# Patient Record
Sex: Female | Born: 1970 | Race: White | Hispanic: No | Marital: Married | Smoking: Never smoker
Health system: Southern US, Community
[De-identification: ages and names within clinical notes are randomized; demographics above are authoritative.]

## PROBLEM LIST (undated history)

## (undated) DIAGNOSIS — M549 Dorsalgia, unspecified: Secondary | ICD-10-CM

## (undated) DIAGNOSIS — G8929 Other chronic pain: Secondary | ICD-10-CM

## (undated) HISTORY — PX: TONSILLECTOMY: SUR1361

## (undated) HISTORY — PX: WISDOM TOOTH EXTRACTION: SHX21

---

## 1995-10-05 HISTORY — PX: SPINAL GROWTH RODS: SHX2427

## 1999-10-22 ENCOUNTER — Other Ambulatory Visit: Admission: RE | Admit: 1999-10-22 | Discharge: 1999-10-22 | Payer: Self-pay | Admitting: Family Medicine

## 2000-11-10 ENCOUNTER — Other Ambulatory Visit: Admission: RE | Admit: 2000-11-10 | Discharge: 2000-11-10 | Payer: Self-pay | Admitting: Family Medicine

## 2001-01-10 ENCOUNTER — Encounter (INDEPENDENT_AMBULATORY_CARE_PROVIDER_SITE_OTHER): Payer: Self-pay

## 2001-01-10 ENCOUNTER — Ambulatory Visit (HOSPITAL_COMMUNITY): Admission: RE | Admit: 2001-01-10 | Discharge: 2001-01-10 | Payer: Self-pay | Admitting: Gastroenterology

## 2001-11-14 ENCOUNTER — Other Ambulatory Visit: Admission: RE | Admit: 2001-11-14 | Discharge: 2001-11-14 | Payer: Self-pay | Admitting: Family Medicine

## 2003-10-28 ENCOUNTER — Other Ambulatory Visit: Admission: RE | Admit: 2003-10-28 | Discharge: 2003-10-28 | Payer: Self-pay | Admitting: Obstetrics and Gynecology

## 2004-04-02 ENCOUNTER — Inpatient Hospital Stay (HOSPITAL_COMMUNITY): Admission: AD | Admit: 2004-04-02 | Discharge: 2004-04-05 | Payer: Self-pay | Admitting: Obstetrics and Gynecology

## 2004-04-06 ENCOUNTER — Encounter: Admission: RE | Admit: 2004-04-06 | Discharge: 2004-05-06 | Payer: Self-pay | Admitting: Obstetrics and Gynecology

## 2005-06-22 ENCOUNTER — Other Ambulatory Visit: Admission: RE | Admit: 2005-06-22 | Discharge: 2005-06-22 | Payer: Self-pay | Admitting: Family Medicine

## 2005-12-28 ENCOUNTER — Other Ambulatory Visit: Admission: RE | Admit: 2005-12-28 | Discharge: 2005-12-28 | Payer: Self-pay | Admitting: Family Medicine

## 2007-06-13 ENCOUNTER — Other Ambulatory Visit: Admission: RE | Admit: 2007-06-13 | Discharge: 2007-06-13 | Payer: Self-pay | Admitting: Family Medicine

## 2008-09-12 ENCOUNTER — Other Ambulatory Visit: Admission: RE | Admit: 2008-09-12 | Discharge: 2008-09-12 | Payer: Self-pay | Admitting: Family Medicine

## 2008-10-04 HISTORY — PX: CHOLECYSTECTOMY: SHX55

## 2009-01-26 ENCOUNTER — Encounter (INDEPENDENT_AMBULATORY_CARE_PROVIDER_SITE_OTHER): Payer: Self-pay | Admitting: General Surgery

## 2009-01-26 ENCOUNTER — Inpatient Hospital Stay (HOSPITAL_COMMUNITY): Admission: EM | Admit: 2009-01-26 | Discharge: 2009-01-27 | Payer: Self-pay | Admitting: Emergency Medicine

## 2009-08-14 ENCOUNTER — Encounter: Admission: RE | Admit: 2009-08-14 | Discharge: 2009-08-14 | Payer: Self-pay | Admitting: Family Medicine

## 2010-01-01 ENCOUNTER — Other Ambulatory Visit: Admission: RE | Admit: 2010-01-01 | Discharge: 2010-01-01 | Payer: Self-pay | Admitting: Family Medicine

## 2010-08-17 ENCOUNTER — Encounter: Admission: RE | Admit: 2010-08-17 | Discharge: 2010-08-17 | Payer: Self-pay | Admitting: Family Medicine

## 2011-01-13 LAB — CBC
HCT: 39.5 % (ref 36.0–46.0)
Hemoglobin: 13.7 g/dL (ref 12.0–15.0)
MCHC: 34.7 g/dL (ref 30.0–36.0)
MCV: 94.7 fL (ref 78.0–100.0)
Platelets: 223 10*3/uL (ref 150–400)
RDW: 12.7 % (ref 11.5–15.5)

## 2011-01-13 LAB — HEPATIC FUNCTION PANEL
ALT: 31 U/L (ref 0–35)
AST: 24 U/L (ref 0–37)
Albumin: 4 g/dL (ref 3.5–5.2)
Alkaline Phosphatase: 76 U/L (ref 39–117)
Bilirubin, Direct: 0.1 mg/dL (ref 0.0–0.3)
Indirect Bilirubin: 0.8 mg/dL (ref 0.3–0.9)
Total Bilirubin: 0.9 mg/dL (ref 0.3–1.2)
Total Protein: 7.8 g/dL (ref 6.0–8.3)

## 2011-01-13 LAB — COMPREHENSIVE METABOLIC PANEL
Albumin: 4 g/dL (ref 3.5–5.2)
Alkaline Phosphatase: 71 U/L (ref 39–117)
BUN: 17 mg/dL (ref 6–23)
Calcium: 9.2 mg/dL (ref 8.4–10.5)
Creatinine, Ser: 0.73 mg/dL (ref 0.4–1.2)
Glucose, Bld: 84 mg/dL (ref 70–99)
Total Protein: 7.6 g/dL (ref 6.0–8.3)

## 2011-01-13 LAB — URINE CULTURE
Colony Count: NO GROWTH
Culture: NO GROWTH

## 2011-01-13 LAB — DIFFERENTIAL
Basophils Relative: 0 % (ref 0–1)
Lymphocytes Relative: 14 % (ref 12–46)
Lymphs Abs: 1.4 10*3/uL (ref 0.7–4.0)
Monocytes Relative: 6 % (ref 3–12)
Neutro Abs: 7.5 10*3/uL (ref 1.7–7.7)
Neutrophils Relative %: 75 % (ref 43–77)

## 2011-01-13 LAB — URINE MICROSCOPIC-ADD ON

## 2011-01-13 LAB — URINALYSIS, ROUTINE W REFLEX MICROSCOPIC
Glucose, UA: NEGATIVE mg/dL
Ketones, ur: 40 mg/dL — AB
Leukocytes, UA: NEGATIVE
pH: 6 (ref 5.0–8.0)

## 2011-01-24 ENCOUNTER — Emergency Department (HOSPITAL_COMMUNITY): Payer: 59

## 2011-01-24 ENCOUNTER — Emergency Department (HOSPITAL_COMMUNITY)
Admission: EM | Admit: 2011-01-24 | Discharge: 2011-01-24 | Discharge status: Against Medical Advice | Payer: 59 | Attending: Emergency Medicine | Admitting: Emergency Medicine

## 2011-01-24 DIAGNOSIS — IMO0001 Reserved for inherently not codable concepts without codable children: Secondary | ICD-10-CM | POA: Insufficient documentation

## 2011-02-16 NOTE — H&P (Signed)
**Note Katie via Obfuscation** [name], Mcpherson             ACCOUNT NO.:  [id number]   MEDICAL RECORD NO.:  [id number]          PATIENT TYPE:  INP   LOCATION:  5118                         FACILITY:  [facility]   PHYSICIAN:  Gabrielle Dare. Janee Morn, M.D.DATE OF BIRTH:  [birth year]   DATE OF ADMISSION:  01/26/2009  DATE OF DISCHARGE:                              HISTORY & PHYSICAL   CHIEF COMPLAINT:  Right upper quadrant abdominal pain/right flank and  back pain.   HISTORY OF PRESENT ILLNESS:  Katie Mcpherson is a 40 year old white female  has no past medical history who began having right flank and back pain  this past Tuesday.  She states that she did not have any associated  nausea or vomiting with this.  She did not have any diarrhea or  constipation and has had normal bowel movements.  She did say that she  had had some emesis with severe pain, but nothing consistent.  Her pain  has wavered over the last several days.  However, yesterday on Saturday,  her pain came back very severe.  She does admit to having several  episodes similar to this in the past at the last 24-48 hours  approximately and then go away.  Due to this pain, the patient presented  to the emergency department overnight and ultrasound was obtained, which  showed a 1.7 cm stone in the neck of the gallbladder with some  associated gallbladder wall thickening.  Because of this, we were called  by Dr. Read Drivers in the ED to see the patient.   REVIEW OF SYSTEMS:  Please see HPI.  Otherwise all other systems are  negative.   FAMILY HISTORY:  Noncontributory.   PAST MEDICAL HISTORY:  Kyphosis deformity of the spine.   PAST SURGICAL HISTORY:  1. C-section.  2. Back surgery with two rods placed in January 1997.  3. Tonsillectomy in high school.   SOCIAL HISTORY:  The patient is married and has one daughter.  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  She does not smoke  and has rare alcohol use and denies any illicit drug use.   ALLERGIES:  NKDA.   MEDICATIONS:  None.   PHYSICAL EXAMINATION:  GENERAL:  Katie Mcpherson is a 39 year old very  pleasant well-developed, well-nourished, white female who is currently  lying in bed in no acute distress, but has just received pain medicine  and states she feels much better.  VITAL SIGNS:  Temperature 98.3, pulse 81, respirations 16, blood  pressure 94/60.  HEENT:  Head is normocephalic, atraumatic.  Sclerae are noninjected.  Pupils are equal, round and reactive to light.  Ears and nose without  any obvious masses or lesions and on rhinorrhea.  Mouth is pink and  moist.  Throat shows no exudate.  NECK:  Supple.  Trachea is midline.  No thyromegaly.  HEART:  Regular rate and rhythm.  Normal S1 and S2.  No murmurs,  gallops, or rubs are noted.  +2 carotid, radial, and pedal pulses  bilaterally.  LUNGS:  Clear to auscultation bilaterally with no wheezes, rhonchi, or  rales noted.  Respiratory effort is nonlabored.  ABDOMEN:  Soft with some right upper quadrant tenderness and a positive  Murphy sign.  She did just receive pain medicine, which I believe has  affected her exam some.  Otherwise, she is nondistended and does have  active bowel sounds.  No other masses or hernias are noted.  MUSCULOSKELETAL:  All four extremities are symmetrical with no cyanosis,  clubbing, or edema.  SKIN:  Warm and dry.  NEUROLOGIC:  Cranial nerves II through XII appear to be grossly intact.  PSYCHIATRY:  The patient is alert and oriented x3 with an appropriate  affect.   LABS AND DIAGNOSTICS:  White blood cell count is 9900, hemoglobin 13.7,  hematocrit 39.5, platelet count is 223,000.  Sodium 136, potassium 3.9,  glucose 84, BUN 17, creatinine 0.73.  AST 24, ALT 28, total bilirubin  0.9, alkaline phosphatase 76.  Ultrasound of the abdomen and pelvis  reveals an 1.7-cm stone in the neck of her gallbladder with some  associated gallbladder wall thickening, but no pericholecystic fluid and  no  dilatation of the common bile duct is noted.   IMPRESSION:  1. Biliary colic.  2. Cholelithiasis.  3. Kyphosis of the spine.   PLAN:  At this time, we will admit the patient and start her empirically  on Unasyn.  She will remain n.p.o. in preparation for surgical  intervention later today.  She is also given various other p.r.n.  medications for pain as well as nausea and vomiting.      Letha Cape, PA      Gabrielle Dare. Janee Morn, M.D.  Electronically Signed    KEO/MEDQ  D:  01/26/2009  T:  01/26/2009  Job:  [id number]

## 2011-02-16 NOTE — Op Note (Signed)
Katie Mcpherson, Katie Mcpherson             ACCOUNT NO.:  [id number]   MEDICAL RECORD NO.:  [id number]          PATIENT TYPE:  INP   LOCATION:  5118                         FACILITY:  [facility]   PHYSICIAN:  Gabrielle Dare. Janee Morn, M.D.DATE OF BIRTH:  1971/05/14   DATE OF PROCEDURE:  01/26/2009  DATE OF DISCHARGE:                               OPERATIVE REPORT   PREOPERATIVE DIAGNOSIS:  Acute cholecystitis.   POSTOPERATIVE DIAGNOSIS:  Acute cholecystitis.   PROCEDURE:  Laparoscopic cholecystectomy.   SURGEON:  Gabrielle Dare. Janee Morn, MD   ASSISTANT:  Wilmon Arms. Corliss Skains, MD   ANESTHESIA:  General endotracheal.   HISTORY OF PRESENT ILLNESS:  Katie Mcpherson is a 40 year old female who  presented to the [facility] Emergency Department with acute onset of  right upper quadrant abdominal pain extending to her back.  Workup  included ultrasound showing gallbladder wall thickening and stones  lodged in the neck of the gallbladder consistent with acute  cholecystitis.  She was admitted and placed on intravenous antibiotics.  She was brought urgently to the operating room for cholecystectomy.   PROCEDURE IN DETAIL:  Informed consent was obtained.  The patient was  identified in the preoperative holding area.  She was receiving  intravenous antibiotics.  She was brought to the operating room and  general endotracheal anesthesia was administered by anesthesia staff.  Her abdomen was prepped and draped in a sterile fashion.  Infraumbilical  region was infiltrated with 0.25% Marcaine with epinephrine.  Infraumbilical incision was made.  Subcutaneous tissues were dissected  down revealing the anterior fascia.  This was divided sharply along the  midline.  The peritoneal cavity was entered under direct vision without  difficulty.  A 0 Vicryl pursestring suture was placed around the fascial  opening.  The Hasson trocar was inserted into the abdomen.  The abdomen  was insufflated with carbon dioxide in the sterile  fashion.  Under  direct vision, an 11-mm epigastric and two 5-mm lateral ports were  placed.  A 0.25% Marcaine with epinephrine was used at all port sites.  Laparoscopic exploration revealed a very inflamed and edematous  gallbladder with a lot of filmy adhesions to the dome and body.  These  were gently swept away using the hook cautery.  These filmy omental  adhesions were cleared down revealing the infundibulum and the dome was  grasped and retracted superomedially.  The infundibulum was retracted  inferolaterally.  Dissection began laterally and progressed medially,  easily identifying the cystic duct.  This was dissected until a large  window was made between the cystic duct, the infundibulum of the  gallbladder and the liver.  Once we had excellent hemostasis, it was  determined that the length of the cystic duct was too short to safely  allow performance of the cholangiogram, so therefore that was not  attempted.  Three clips were placed proximally on the cystic duct and  one was placed distally and it was divided.  Further dissection revealed  both in the anterior branch and the posterior branch of the cystic  artery.  These were both clipped twice proximally once distally and  divided.  The gallbladder was taken off the liver bed with a Bovie  cautery achieving excellent hemostasis along the way.  It was placed in  an Endocatch bag and removed from the abdomen via the infraumbilical  port site.  It contained numerous large stones.  It was sent to  Pathology.  The liver bed was then copiously irrigated, meticulous  hemostasis was assured.  Clips all remained in excellent position.  The  irrigation fluid returned clear.  Clips were rechecked and were good.  The liver bed remained dry.  Ports were then removed under direct  vision.  The pneumoperitoneum was released.  The Hasson trocar was  removed.  The infraumbilical fascia was closed by tying with 0 Vicryl  pursestring suture  with care not to strap the intraabdominal contents.  All four wounds were copiously irrigated and the skin of each was closed  with running 4-0 Vicryl subcuticular stitch followed by Dermabond.  Sponge, needle, and instrument counts were all correct.  The patient  tolerated the procedure well without apparent complications.  She was  taken to the recovery room in stable condition.      Gabrielle Dare Janee Morn, M.D.  Electronically Signed     BET/MEDQ  D:  01/26/2009  T:  01/27/2009  Job:  [id number]

## 2011-02-19 NOTE — Discharge Summary (Signed)
NAMEJEMINA, Katie Mcpherson                         ACCOUNT NO.:  [id number]   MEDICAL RECORD NO.:  [id number]                   PATIENT TYPE:  INP   LOCATION:  9132                                 FACILITY:  [facility]   PHYSICIAN:  Zenaida Niece, M.D.             DATE OF BIRTH:  1971-07-09   DATE OF ADMISSION:  04/02/2004  DATE OF DISCHARGE:  04/05/2004                                 DISCHARGE SUMMARY   ADMISSION DIAGNOSES:  1. Intrauterine pregnancy at 36 weeks.  2. Premature rupture of membranes.  3. Breech presentation.   DISCHARGE DIAGNOSES:  1. Intrauterine pregnancy at 36 weeks.  2. Premature rupture of membranes.  3. Breech presentation.   PROCEDURES:  On April 02, 2004 she had a primary low transverse cesarean  section.   HISTORY AND PHYSICAL:  Please see chart for full history and physical.  Briefly, this is a 40 year old white female gravida 1 para 0 with an EGA of  36+ weeks who presented to the office with a complaint of leaking fluid  since the previous evening.  Exam confirmed rupture of membranes.  She was 1  cm dilated and had a breech presentation.  She was thus admitted for primary  cesarean section.  Prenatal care complicated by advanced maternal age and  she has a history of severe scoliosis with Harrington rod placement.  Past  medical history also significant for irritable bowel syndrome.  Physical  exam significant for a benign abdomen with a fundal height of 34 cm.   HOSPITAL COURSE:  The patient was admitted on June 30 and underwent a  primary low transverse cesarean section under spinal anesthesia.  She  delivered a viable female infant from the frank breech presentation.  The  baby had Apgars of 8 and 9 and weighed 5 pounds 7 ounces.  Postoperatively  she had no complications.  Predelivery hemoglobin was 13.1, post delivery  was 11.2.  On postoperative day #3 she was stable for discharge and  requested discharge home.  At that time her incision was healing  well,  staples were removed, and Steri-Strips applied.   DISCHARGE INSTRUCTIONS:  1. Regular diet, pelvic rest, no strenuous activity.  2. Follow-up is in approximately 2 weeks.  3. Medications are Percocet #30 one to two p.o. q.4-6h. p.r.n. pain and over-     the-counter ibuprofen as needed.  4. She was given our discharge pamphlet.                                               Zenaida Niece, M.D.    TDM/MEDQ  D:  04/05/2004  T:  04/06/2004  Job:  [id number]

## 2011-02-19 NOTE — Op Note (Signed)
Katie Mcpherson, Katie Mcpherson                         ACCOUNT NO.:  [id number]   MEDICAL RECORD NO.:  [id number]                   PATIENT TYPE:  INP   LOCATION:  9198                                 FACILITY:  [facility]   PHYSICIAN:  Huel Cote, M.D.              DATE OF BIRTH:  26-Apr-1971   DATE OF PROCEDURE:  04/02/2004  DATE OF DISCHARGE:                                 OPERATIVE REPORT   PREOPERATIVE DIAGNOSES:  1. Breech presentation.  2. 36 plus weeks gestation.  3. Premature rupture of membranes.  4. Group B strep positive status.   POSTOPERATIVE DIAGNOSES:  1. Breech presentation.  2. 36 plus weeks gestation.  3. Premature rupture of membranes.  4. Group B strep positive status.   PROCEDURE:  Primary low transverse cesarean section.   SURGEON:  Huel Cote, M.D.   ANESTHESIA:  Spinal.   FLUIDS:  Estimated blood loss 500 mL, urine output 150 mL clear urine, IV  fluids 2200 mL LR.   FINDINGS:  A vigorous female infant in the frank breech presentation, Apgar  are 8&9, weight was 5 pounds 7 ounces, uterus, tubes and ovaries were all  normal.   DESCRIPTION OF PROCEDURE:  The patient was taken to the operating room where  spinal anesthesia was obtained without difficulty. She was then prepped and  draped in the normal sterile fashion in the dorsal supine position with a  leftward tilt with a Foley catheter in place. A Pfannenstiel skin incision  was made with a scalpel and carried through to the underlying layer of  fascia by Bovie cautery dissection. The fascia was then opened in the  midline and the incision was extended laterally with Mayo scissors. The  inferior aspect of the incision was grasped with Kocher clamps, elevated and  dissected off the underlying rectus muscles.  In a similar fashion, the  superior aspect was grasped with Kocher clamps and dissected off the rectus  muscles. The rectus muscles were then separated in the midline and the  peritoneum was  entered bluntly. There was some slight adhesion of the  omentum to the anterior abdominal wall which was taken down both bluntly and  sharply and the omentum tucked to the right side of the uterine fundus.  The  vesicouterine peritoneum was then identified and the bladder flap created  with the Metzenbaum scissors.  The bladder was then pushed away from the  lower uterine segment which was incised in a transverse fashion. The uterine  cavity was then entered bluntly and the incision was extended digitally. The  sacrum and bottom of infant were then delivered in frank breech  presentation. The legs were reduced carefully and the infant delivered up to  the level of the scapula with the infant's arms gently reduced over the  chest.  The head was delivered in a flex position, the infant was bulb  suctioned and handed to the pediatrician after delivery  and cord clamped.  The placenta was then delivered manually and the uterus cleared of all clots  and debris with a moist lap sponge. The uterine incision was then closed  with #0 chromic in a running locked fashion.  Good hemostasis of the  incision was noted after closure. The tubes and ovaries were carefully  inspected and found to be normal.  Again the bladder flap and the uterine  incision were inspected and found to be completely hemostatic. There was no  active bleeding noted therefore all instruments and sponges were removed  from the patient's abdomen and the subfascial planes inspected with no  bleeding noted. The fascia was then closed with #0 Vicryl in a running  fashion. The subcutaneous tissue was reapproximated with 3-0 Vicryl and  subcutaneous running stitch and the skin was closed with staples. Sponge,  lap and needle counts were correct x2 and the patient was taken to the  recovery room in stable condition.                                               Huel Cote, M.D.    KR/MEDQ  D:  04/02/2004  T:  04/03/2004  Job:   [id number]

## 2011-02-19 NOTE — Procedures (Signed)
[facility]  Patient:    Katie Mcpherson, Katie Mcpherson                      MRN: [id number] Proc. Date: 01/10/01 Adm. Date:  [id number] Attending:  Louie Bun CC:         Dario Guardian, M.D.   Procedure Report  PROCEDURE:  Colonoscopy with biopsy.  SURGEON:  John C. Madilyn Fireman, M.D.  INDICATIONS FOR PROCEDURE:  Rectal bleeding and lower abdominal pain in a patient who has a reported history of Crohns disease workup in another state.  DESCRIPTION OF PROCEDURE:  The patient was placed in the left lateral decubitus position and placed on the pulse monitor with continuous low flow oxygen delivered by nasal cannula.  She was sedated with 80 mg of IV Demerol and 9 mg of IV Versed.  The Olympus video colonoscope was inserted into the rectum and advanced to the cecum, confirmed by transillumination at McBurneys point and visualization of the ileocecal valve and appendiceal orifice, as well as intubation of the terminal ileum.  Prep was excellent.  The terminal ileum appeared completely normal as did the ileocecal valve and cecum. Likewise, the ascending, transverse, descending, and sigmoid colon appeared normal with no mucosal abnormalities, polyps, diverticula, or other abnormalities.  The rectum likewise appeared normal.  I did obtain some random rectal biopsies to rule out microscopic colitis.  Retroflexed view of the anus revealed some small internal hemorrhoids and no obvious stigma of hemorrhage. The colonoscope was then withdrawn and the patient returned to the recovery room in stable condition.  She tolerated the procedure well and there were no immediate complications.  IMPRESSION:  Internal hemorrhoids, otherwise normal colonoscopy including terminal ileum.  PLAN:  Will treat for irritable bowel syndrome while awaiting biopsies to rule out a microscopic or collagenous colitis.  Will begin Levbid and follow up in the office in a few weeks. DD:   01/10/01 TD:  01/10/01 Job: [id number]

## 2011-02-19 NOTE — H&P (Signed)
Katie Mcpherson, TEST NO.:  [id number]   MEDICAL RECORD NO.:  [id number]                   PATIENT TYPE:  MAT   LOCATION:  MATC                                 FACILITY:  [facility]   PHYSICIAN:  Huel Cote, M.D.              DATE OF BIRTH:  [birth year]   DATE OF ADMISSION:  04/02/2004  DATE OF DISCHARGE:                                HISTORY & PHYSICAL   The patient is a 40 year old G1, P0, who is admitted at 36+ weeks' gestation  for cesarean section given a finding of ruptured membranes in the office and  breech presentation.  The patient states she began to have possible leakage  of fluid last p.m. at around 10 o'clock and indeed was confirmed to be  positive Nitrazine, fern, and pool in the office.  Given the breech  presentation, her option of cesarean section was discussed with the patient  and she is agreeable to proceeding.  Prenatal care had been complicated by  advanced maternal age.  Patient declined any genetic testing.  The patient  also had a history of infertility, however did conceive this pregnancy  spontaneously.  She has a history of severe scoliosis with Harrington rod  placements and has had an anesthesia consult for this.   PRENATAL LABORATORY DATA:  B positive, antibody negative.  RPR nonreactive.  Rubella immune.  Hepatitis B surface antigen negative.  HIV declined.  GC  and Chlamydia negative.  Cystic fibrosis screening negative with Dr. Chevis Pretty.   PAST OBSTETRICAL HISTORY:  None.   PAST MEDICAL HISTORY:  Significant for irritable bowel syndrome and the  history of kyphosis with Harrington rod placement.   PAST SURGICAL HISTORY:  Harrington rod placement in 1997.   PAST GYNECOLOGIC HISTORY:  History of infertility with four cycles of  ovulation induction; however, patient conceived on an off-cycle.   The patient has no known drug allergies.   She is currently on no medications.   PHYSICAL EXAMINATION:  VITAL SIGNS:  Blood  pressure 110/86, weight is 157  pounds.  CARDIAC:  Regular rate and rhythm.  CHEST:  Lungs are clear.  ABDOMEN:  Soft and nontender and gravid.  Fundal height at 34.  Estimated  fetal weight is 5-1/2 to 6 pounds.  PELVIC:  Cervical exam is 1 and 70 and a -2 station with a breech  presentation confirmed by both exam and ultrasound.  She did have a group B  strep performed on the day of admission.   Risks and benefits of  proceeding with cesarean section were discussed with  the patient in detail, including bleeding, infection, and possible damage to  bowel and bladder.  The patient understands these risks; however, given the  breech presentation, the risk of laboring for the fetus is unacceptable and  is agreeable to proceeding with cesarean section.  Huel Cote, M.D.    KR/MEDQ  D:  04/02/2004  T:  04/02/2004  Job:  [id number]

## 2011-07-14 ENCOUNTER — Other Ambulatory Visit: Payer: Self-pay | Admitting: Family Medicine

## 2011-07-14 DIAGNOSIS — Z1231 Encounter for screening mammogram for malignant neoplasm of breast: Secondary | ICD-10-CM

## 2011-09-01 ENCOUNTER — Ambulatory Visit
Admission: RE | Admit: 2011-09-01 | Discharge: 2011-09-01 | Disposition: A | Discharge status: Home / Self Care | Payer: 59 | Source: Ambulatory Visit | Attending: Family Medicine | Admitting: Family Medicine

## 2011-09-01 DIAGNOSIS — Z1231 Encounter for screening mammogram for malignant neoplasm of breast: Secondary | ICD-10-CM

## 2012-01-27 ENCOUNTER — Other Ambulatory Visit: Payer: Self-pay | Admitting: Family Medicine

## 2012-01-27 ENCOUNTER — Other Ambulatory Visit (HOSPITAL_COMMUNITY)
Admission: RE | Admit: 2012-01-27 | Discharge: 2012-01-27 | Disposition: A | Discharge status: Home / Self Care | Payer: 59 | Source: Ambulatory Visit | Attending: Family Medicine | Admitting: Family Medicine

## 2012-01-27 DIAGNOSIS — Z0000 Encounter for general adult medical examination without abnormal findings: Secondary | ICD-10-CM | POA: Insufficient documentation

## 2012-03-29 ENCOUNTER — Other Ambulatory Visit: Payer: Self-pay | Admitting: Family Medicine

## 2012-03-29 DIAGNOSIS — M545 Low back pain, unspecified: Secondary | ICD-10-CM

## 2012-03-29 DIAGNOSIS — M546 Pain in thoracic spine: Secondary | ICD-10-CM

## 2012-04-24 ENCOUNTER — Other Ambulatory Visit: Payer: 59

## 2012-04-24 ENCOUNTER — Other Ambulatory Visit (HOSPITAL_COMMUNITY): Payer: 59

## 2012-04-28 ENCOUNTER — Ambulatory Visit (HOSPITAL_COMMUNITY)
Admission: RE | Admit: 2012-04-28 | Discharge: 2012-04-28 | Disposition: A | Discharge status: Home / Self Care | Payer: 59 | Source: Ambulatory Visit | Attending: Family Medicine | Admitting: Family Medicine

## 2012-04-28 DIAGNOSIS — M5126 Other intervertebral disc displacement, lumbar region: Secondary | ICD-10-CM | POA: Insufficient documentation

## 2012-04-28 DIAGNOSIS — M546 Pain in thoracic spine: Secondary | ICD-10-CM | POA: Insufficient documentation

## 2012-04-28 DIAGNOSIS — M545 Low back pain, unspecified: Secondary | ICD-10-CM | POA: Insufficient documentation

## 2012-04-28 DIAGNOSIS — Z981 Arthrodesis status: Secondary | ICD-10-CM | POA: Insufficient documentation

## 2012-10-27 ENCOUNTER — Encounter: Payer: Self-pay | Admitting: Obstetrics and Gynecology

## 2013-06-28 ENCOUNTER — Other Ambulatory Visit: Payer: Self-pay

## 2013-06-28 DIAGNOSIS — Z1231 Encounter for screening mammogram for malignant neoplasm of breast: Secondary | ICD-10-CM

## 2013-08-07 ENCOUNTER — Ambulatory Visit
Admission: RE | Admit: 2013-08-07 | Discharge: 2013-08-07 | Disposition: A | Discharge status: Home / Self Care | Payer: 59 | Source: Ambulatory Visit

## 2013-08-07 DIAGNOSIS — Z1231 Encounter for screening mammogram for malignant neoplasm of breast: Secondary | ICD-10-CM

## 2013-08-09 ENCOUNTER — Other Ambulatory Visit: Payer: Self-pay | Admitting: Family Medicine

## 2013-08-09 DIAGNOSIS — R928 Other abnormal and inconclusive findings on diagnostic imaging of breast: Secondary | ICD-10-CM

## 2013-09-06 ENCOUNTER — Other Ambulatory Visit: Payer: 59

## 2013-09-07 ENCOUNTER — Ambulatory Visit
Admission: RE | Admit: 2013-09-07 | Discharge: 2013-09-07 | Disposition: A | Discharge status: Home / Self Care | Payer: 59 | Source: Ambulatory Visit | Attending: Family Medicine | Admitting: Family Medicine

## 2013-09-07 DIAGNOSIS — R928 Other abnormal and inconclusive findings on diagnostic imaging of breast: Secondary | ICD-10-CM

## 2014-05-06 ENCOUNTER — Encounter (HOSPITAL_BASED_OUTPATIENT_CLINIC_OR_DEPARTMENT_OTHER): Payer: Self-pay | Admitting: *Deleted

## 2014-05-06 NOTE — Progress Notes (Signed)
No labs needed

## 2014-05-10 ENCOUNTER — Ambulatory Visit (HOSPITAL_BASED_OUTPATIENT_CLINIC_OR_DEPARTMENT_OTHER)
Admission: RE | Admit: 2014-05-10 | Discharge: 2014-05-10 | Disposition: A | Discharge status: Home / Self Care | Payer: 59 | Source: Ambulatory Visit | Attending: Otolaryngology | Admitting: Otolaryngology

## 2014-05-10 ENCOUNTER — Encounter (HOSPITAL_BASED_OUTPATIENT_CLINIC_OR_DEPARTMENT_OTHER)
Admission: RE | Disposition: A | Discharge status: Home / Self Care | Payer: Self-pay | Source: Ambulatory Visit | Attending: Otolaryngology

## 2014-05-10 ENCOUNTER — Ambulatory Visit (HOSPITAL_BASED_OUTPATIENT_CLINIC_OR_DEPARTMENT_OTHER): Payer: 59 | Admitting: Certified Registered"

## 2014-05-10 ENCOUNTER — Encounter (HOSPITAL_BASED_OUTPATIENT_CLINIC_OR_DEPARTMENT_OTHER): Payer: 59 | Admitting: Certified Registered"

## 2014-05-10 ENCOUNTER — Encounter (HOSPITAL_BASED_OUTPATIENT_CLINIC_OR_DEPARTMENT_OTHER): Payer: Self-pay

## 2014-05-10 DIAGNOSIS — J3489 Other specified disorders of nose and nasal sinuses: Secondary | ICD-10-CM | POA: Insufficient documentation

## 2014-05-10 DIAGNOSIS — J342 Deviated nasal septum: Secondary | ICD-10-CM | POA: Diagnosis present

## 2014-05-10 DIAGNOSIS — J343 Hypertrophy of nasal turbinates: Secondary | ICD-10-CM | POA: Diagnosis not present

## 2014-05-10 DIAGNOSIS — J329 Chronic sinusitis, unspecified: Secondary | ICD-10-CM | POA: Diagnosis not present

## 2014-05-10 HISTORY — DX: Dorsalgia, unspecified: M54.9

## 2014-05-10 HISTORY — PX: SINUS ENDO W/FUSION: SHX777

## 2014-05-10 HISTORY — PX: NASAL SEPTOPLASTY W/ TURBINOPLASTY: SHX2070

## 2014-05-10 HISTORY — DX: Other chronic pain: G89.29

## 2014-05-10 LAB — POCT HEMOGLOBIN-HEMACUE: Hemoglobin: 14.5 g/dL (ref 12.0–15.0)

## 2014-05-10 SURGERY — SEPTOPLASTY, NOSE, WITH NASAL TURBINATE REDUCTION
Anesthesia: General | Site: Nose

## 2014-05-10 MED ORDER — CEPHALEXIN 500 MG PO CAPS: 500.0000 mg | ORAL_CAPSULE | Freq: Three times a day (TID) | ORAL | Status: AC

## 2014-05-10 MED ORDER — MIDAZOLAM HCL 2 MG/2ML IJ SOLN
INTRAMUSCULAR | Status: AC
  Filled 2014-05-10: qty 2

## 2014-05-10 MED ORDER — ONDANSETRON HCL 4 MG/2ML IJ SOLN
4.0000 mg | Freq: Once | INTRAMUSCULAR | Status: DC | PRN
Start: 2014-05-10 — End: 2014-05-10

## 2014-05-10 MED ORDER — BACITRACIN ZINC 500 UNIT/GM EX OINT
TOPICAL_OINTMENT | CUTANEOUS | Status: AC
  Filled 2014-05-10: qty 28.35

## 2014-05-10 MED ORDER — MIDAZOLAM HCL 2 MG/2ML IJ SOLN
1.0000 mg | INTRAMUSCULAR | Status: DC | PRN
  Administered 2014-05-10: 2 mg via INTRAVENOUS

## 2014-05-10 MED ORDER — MIDAZOLAM HCL 5 MG/5ML IJ SOLN
INTRAMUSCULAR | Status: DC | PRN
  Administered 2014-05-10: 2 mg via INTRAVENOUS

## 2014-05-10 MED ORDER — NEOSTIGMINE METHYLSULFATE 10 MG/10ML IV SOLN
INTRAVENOUS | Status: DC | PRN
  Administered 2014-05-10: 3 mg via INTRAVENOUS

## 2014-05-10 MED ORDER — MEPERIDINE HCL 25 MG/ML IJ SOLN: 6.2500 mg | INTRAMUSCULAR | Status: DC | PRN

## 2014-05-10 MED ORDER — HYDROMORPHONE HCL PF 1 MG/ML IJ SOLN
INTRAMUSCULAR | Status: AC
  Filled 2014-05-10: qty 1

## 2014-05-10 MED ORDER — OXYMETAZOLINE HCL 0.05 % NA SOLN
NASAL | Status: DC | PRN
  Administered 2014-05-10: 1 via NASAL

## 2014-05-10 MED ORDER — FENTANYL CITRATE 0.05 MG/ML IJ SOLN
INTRAMUSCULAR | Status: DC | PRN
  Administered 2014-05-10 (×2): 50 ug via INTRAVENOUS
  Administered 2014-05-10: 25 ug via INTRAVENOUS

## 2014-05-10 MED ORDER — PROPOFOL 10 MG/ML IV BOLUS
INTRAVENOUS | Status: DC | PRN
  Administered 2014-05-10: 200 mg via INTRAVENOUS

## 2014-05-10 MED ORDER — LIDOCAINE-EPINEPHRINE 1 %-1:100000 IJ SOLN
INTRAMUSCULAR | Status: AC
  Filled 2014-05-10: qty 1

## 2014-05-10 MED ORDER — OXYCODONE HCL 5 MG PO TABS
ORAL_TABLET | ORAL | Status: AC
  Filled 2014-05-10: qty 1

## 2014-05-10 MED ORDER — DEXAMETHASONE SODIUM PHOSPHATE 4 MG/ML IJ SOLN
INTRAMUSCULAR | Status: DC | PRN
  Administered 2014-05-10: 8 mg via INTRAVENOUS

## 2014-05-10 MED ORDER — FENTANYL CITRATE 0.05 MG/ML IJ SOLN
INTRAMUSCULAR | Status: AC
  Filled 2014-05-10: qty 6

## 2014-05-10 MED ORDER — LIDOCAINE HCL (CARDIAC) 20 MG/ML IV SOLN
INTRAVENOUS | Status: DC | PRN
  Administered 2014-05-10: 100 mg via INTRAVENOUS

## 2014-05-10 MED ORDER — ONDANSETRON 8 MG PO TBDP
ORAL_TABLET | ORAL | Status: AC
  Filled 2014-05-10: qty 1

## 2014-05-10 MED ORDER — ONDANSETRON 8 MG PO TBDP
8.0000 mg | ORAL_TABLET | Freq: Once | ORAL | Status: AC | PRN
  Administered 2014-05-10: 8 mg via ORAL

## 2014-05-10 MED ORDER — SODIUM CHLORIDE 0.9 % IV SOLN
INTRAVENOUS | Status: DC | PRN
  Administered 2014-05-10: 200 mL

## 2014-05-10 MED ORDER — GLYCOPYRROLATE 0.2 MG/ML IJ SOLN
INTRAMUSCULAR | Status: DC | PRN
  Administered 2014-05-10: 0.4 mg via INTRAVENOUS

## 2014-05-10 MED ORDER — FENTANYL CITRATE 0.05 MG/ML IJ SOLN: 50.0000 ug | INTRAMUSCULAR | Status: DC | PRN

## 2014-05-10 MED ORDER — ROCURONIUM BROMIDE 100 MG/10ML IV SOLN
INTRAVENOUS | Status: DC | PRN
  Administered 2014-05-10: 25 mg via INTRAVENOUS

## 2014-05-10 MED ORDER — ONDANSETRON HCL 4 MG/2ML IJ SOLN
INTRAMUSCULAR | Status: DC | PRN
  Administered 2014-05-10: 4 mg via INTRAVENOUS

## 2014-05-10 MED ORDER — HYDROCODONE-ACETAMINOPHEN 7.5-325 MG PO TABS: 1.0000 | ORAL_TABLET | Freq: Four times a day (QID) | ORAL | Status: AC | PRN

## 2014-05-10 MED ORDER — LIDOCAINE-EPINEPHRINE 1 %-1:100000 IJ SOLN
INTRAMUSCULAR | Status: DC | PRN
  Administered 2014-05-10: 5.5 mL

## 2014-05-10 MED ORDER — OXYCODONE HCL 5 MG PO TABS
5.0000 mg | ORAL_TABLET | Freq: Once | ORAL | Status: AC | PRN
  Administered 2014-05-10: 5 mg via ORAL

## 2014-05-10 MED ORDER — OXYMETAZOLINE HCL 0.05 % NA SOLN
NASAL | Status: AC
  Filled 2014-05-10: qty 15

## 2014-05-10 MED ORDER — HYDROMORPHONE HCL PF 1 MG/ML IJ SOLN
0.2500 mg | INTRAMUSCULAR | Status: DC | PRN
  Administered 2014-05-10 (×2): 0.5 mg via INTRAVENOUS

## 2014-05-10 MED ORDER — OXYCODONE HCL 5 MG/5ML PO SOLN: 5.0000 mg | Freq: Once | ORAL | Status: AC | PRN

## 2014-05-10 MED ORDER — LACTATED RINGERS IV SOLN
INTRAVENOUS | Status: DC
  Administered 2014-05-10 (×2): via INTRAVENOUS

## 2014-05-10 MED ORDER — SUCCINYLCHOLINE CHLORIDE 20 MG/ML IJ SOLN
INTRAMUSCULAR | Status: DC | PRN
  Administered 2014-05-10: 120 mg via INTRAVENOUS

## 2014-05-10 SURGICAL SUPPLY — 71 items
ATTRACTOMAT 16X20 MAGNETIC DRP (DRAPES) IMPLANT
BLADE RAD40 ROTATE 4M 4 5PK (BLADE) IMPLANT
BLADE RAD40 ROTATE 4M 4MM 5PK (BLADE)
BLADE RAD60 ROTATE M4 4 5PK (BLADE) IMPLANT
BLADE RAD60 ROTATE M4 4MM 5PK (BLADE)
BLADE ROTATE RAD 12 4 M4 (BLADE) IMPLANT
BLADE ROTATE RAD 12 4MM M4 (BLADE)
BLADE ROTATE RAD 40 4 M4 (BLADE) IMPLANT
BLADE ROTATE RAD 40 4MM M4 (BLADE)
BLADE ROTATE TRICUT 4MX13CM M4 (BLADE) ×1
BLADE ROTATE TRICUT 4X13 M4 (BLADE) ×3 IMPLANT
BLADE TRICUT ROTATE M4 4 5PK (BLADE) IMPLANT
BLADE TRICUT ROTATE M4 4MM 5PK (BLADE)
BUR HS RAD FRONTAL 3 (BURR) IMPLANT
BUR HS RAD FRONTAL 3MM (BURR)
CANISTER SUC SOCK COL 7IN (MISCELLANEOUS) ×4 IMPLANT
CANISTER SUCT 1200ML W/VALVE (MISCELLANEOUS) ×4 IMPLANT
COAGULATOR SUCT 6 FR SWTCH (ELECTROSURGICAL) ×1
COAGULATOR SUCT SWTCH 10FR 6 (ELECTROSURGICAL) ×3 IMPLANT
DECANTER SPIKE VIAL GLASS SM (MISCELLANEOUS) ×4 IMPLANT
DRAPE SURG 17X23 STRL (DRAPES) IMPLANT
DRESSING NASAL KENNEDY 3.5X.9 (MISCELLANEOUS) IMPLANT
DRSG NASAL KENNEDY 3.5X.9 (MISCELLANEOUS)
DRSG NASOPORE 8CM (GAUZE/BANDAGES/DRESSINGS) IMPLANT
DRSG TELFA 3X8 NADH (GAUZE/BANDAGES/DRESSINGS) IMPLANT
ELECT COATED BLADE 2.86 ST (ELECTRODE) IMPLANT
ELECT REM PT RETURN 9FT ADLT (ELECTROSURGICAL) ×4
ELECTRODE REM PT RTRN 9FT ADLT (ELECTROSURGICAL) ×2 IMPLANT
GLOVE EXAM NITRILE MD LF STRL (GLOVE) ×4 IMPLANT
GLOVE SS BIOGEL STRL SZ 7.5 (GLOVE) ×2 IMPLANT
GLOVE SUPERSENSE BIOGEL SZ 7.5 (GLOVE) ×2
GLOVE SURG SS PI 7.0 STRL IVOR (GLOVE) ×4 IMPLANT
GOWN STRL REUS W/ TWL LRG LVL3 (GOWN DISPOSABLE) ×4 IMPLANT
GOWN STRL REUS W/TWL LRG LVL3 (GOWN DISPOSABLE) ×4
IV NS 1000ML (IV SOLUTION)
IV NS 1000ML BAXH (IV SOLUTION) IMPLANT
IV NS 500ML (IV SOLUTION) ×2
IV NS 500ML BAXH (IV SOLUTION) ×2 IMPLANT
NEEDLE 27GAX1X1/2 (NEEDLE) ×4 IMPLANT
NEEDLE SPNL 25GX3.5 QUINCKE BL (NEEDLE) IMPLANT
NS IRRIG 1000ML POUR BTL (IV SOLUTION) ×4 IMPLANT
PACK BASIN DAY SURGERY FS (CUSTOM PROCEDURE TRAY) ×4 IMPLANT
PACK ENT DAY SURGERY (CUSTOM PROCEDURE TRAY) ×4 IMPLANT
PAD ENT ADHESIVE 25PK (MISCELLANEOUS) ×4 IMPLANT
PATTIES SURGICAL .5 X3 (DISPOSABLE) ×4 IMPLANT
PENCIL FOOT CONTROL (ELECTRODE) IMPLANT
SLEEVE SCD COMPRESS KNEE MED (MISCELLANEOUS) ×4 IMPLANT
SOLUTION ANTI FOG 6CC (MISCELLANEOUS) ×4 IMPLANT
SPONGE GAUZE 2X2 8PLY STER LF (GAUZE/BANDAGES/DRESSINGS) ×1
SPONGE GAUZE 2X2 8PLY STRL LF (GAUZE/BANDAGES/DRESSINGS) ×3 IMPLANT
SPONGE SURGIFOAM ABS GEL 12-7 (HEMOSTASIS) IMPLANT
SUT CHROMIC 3 0 PS 2 (SUTURE) IMPLANT
SUT CHROMIC 4 0 P 3 18 (SUTURE) ×4 IMPLANT
SUT ETHILON 3 0 PS 1 (SUTURE) IMPLANT
SUT ETHILON 4 0 CL P 3 (SUTURE) IMPLANT
SUT ETHILON 5 0 PC 1 (SUTURE) IMPLANT
SUT ETHILON 6 0 P 1 (SUTURE) IMPLANT
SUT PLAIN 4 0 ~~LOC~~ 1 (SUTURE) ×4 IMPLANT
SUT VIC AB 4-0 P-3 18XBRD (SUTURE) IMPLANT
SUT VIC AB 4-0 P3 18 (SUTURE)
SUT VIC AB 5-0 P-3 18X BRD (SUTURE) IMPLANT
SUT VIC AB 5-0 P3 18 (SUTURE)
SYR BULB 3OZ (MISCELLANEOUS) IMPLANT
TOWEL OR 17X24 6PK STRL BLUE (TOWEL DISPOSABLE) ×4 IMPLANT
TRACKER ENT INSTRUMENT (MISCELLANEOUS) ×4 IMPLANT
TRACKER ENT PATIENT (MISCELLANEOUS) ×4 IMPLANT
TRAY DSU PREP LF (CUSTOM PROCEDURE TRAY) ×4 IMPLANT
TUBE CONNECTING 20'X1/4 (TUBING) ×1
TUBE CONNECTING 20X1/4 (TUBING) ×3 IMPLANT
TUBING STRAIGHTSHOT EPS 5PK (TUBING) ×4 IMPLANT
YANKAUER SUCT BULB TIP NO VENT (SUCTIONS) ×4 IMPLANT

## 2014-05-10 NOTE — Anesthesia Procedure Notes (Signed)
Procedure Name: Intubation Date/Time: 05/10/2014 7:56 AM Performed by: Renella CunasHAZEL, Katie Mcpherson Pre-anesthesia Checklist: Patient identified, Emergency Drugs available, Suction available and Patient being monitored Patient Re-evaluated:Patient Re-evaluated prior to inductionOxygen Delivery Method: Circle System Utilized Preoxygenation: Pre-oxygenation with 100% oxygen Intubation Type: IV induction Ventilation: Mask ventilation without difficulty Laryngoscope Size: Mac and 3 Grade View: Grade I Tube type: Oral Tube size: 7.0 mm Number of attempts: 1 Airway Equipment and Method: stylet and oral airway Placement Confirmation: ETT inserted through vocal cords under direct vision,  positive ETCO2 and breath sounds checked- equal and bilateral Secured at: 22 cm Tube secured with: Tape Dental Injury: Teeth and Oropharynx as per pre-operative assessment

## 2014-05-10 NOTE — Transfer of Care (Signed)
Immediate Anesthesia Transfer of Care Note  Patient: Katie Mcpherson  Procedure(s) Performed: Procedure(s) (LRB): NASAL SEPTOPLASTY WITH TURBINATE REDUCTION (N/A) ENDOSCOPIC SINUS SURGERY WITH FUSION NAVIGATION (Bilateral)  Patient Location: PACU  Anesthesia Type: General  Level of Consciousness: awake, oriented, sedated and patient cooperative  Airway & Oxygen Therapy: Patient Spontanous Breathing and Patient connected to face mask oxygen  Post-op Assessment: Report given to PACU RN and Post -op Vital signs reviewed and stable  Post vital signs: Reviewed and stable  Complications: No apparent anesthesia complications

## 2014-05-10 NOTE — Anesthesia Preprocedure Evaluation (Signed)
Anesthesia Evaluation  Patient identified by MRN, date of birth, ID band Patient awake    Reviewed: Allergy & Precautions, H&P , NPO status , Patient's Chart, lab work & pertinent test results  Airway Mallampati: I TM Distance: >3 FB Neck ROM: Full    Dental   Pulmonary          Cardiovascular     Neuro/Psych    GI/Hepatic   Endo/Other    Renal/GU      Musculoskeletal   Abdominal   Peds  Hematology   Anesthesia Other Findings   Reproductive/Obstetrics                           Anesthesia Physical Anesthesia Plan  ASA: II  Anesthesia Plan: General   Post-op Pain Management:    Induction: Intravenous  Airway Management Planned: Oral ETT  Additional Equipment:   Intra-op Plan:   Post-operative Plan: Extubation in OR  Informed Consent: I have reviewed the patients History and Physical, chart, labs and discussed the procedure including the risks, benefits and alternatives for the proposed anesthesia with the patient or authorized representative who has indicated his/her understanding and acceptance.     Plan Discussed with: CRNA and Surgeon  Anesthesia Plan Comments:         Anesthesia Quick Evaluation  

## 2014-05-10 NOTE — Anesthesia Postprocedure Evaluation (Signed)
Anesthesia Post Note  Patient: Katie Mcpherson  Procedure(s) Performed: Procedure(s) (LRB): NASAL SEPTOPLASTY WITH TURBINATE REDUCTION (N/A) ENDOSCOPIC SINUS SURGERY WITH FUSION NAVIGATION (Bilateral)  Anesthesia type: general  Patient location: PACU  Post pain: Pain level controlled  Post assessment: Patient's Cardiovascular Status Stable  Last Vitals:  Filed Vitals:   05/10/14 1130  BP: 103/65  Pulse: 69  Temp: 36.2 C  Resp: 16    Post vital signs: Reviewed and stable  Level of consciousness: sedated  Complications: No apparent anesthesia complications

## 2014-05-10 NOTE — Discharge Instructions (Signed)

## 2014-05-10 NOTE — Op Note (Signed)
Preop/postop diagnoses: Deviated septum, turbinate hypertrophy, and chronic sinusitis Procedure: Septoplasty, submucous resection inferior turbinates, and right maxillary antrostomy with anterior ethmoidectomy Anesthesia: Gen. Estimated blood loss: Less than 25 cc Indications: 43 year old who's had significant pain and pressure in her face and she is very frustrated. She has failed medical therapy. She has a spur and severe nasal obstruction that we discussed a septoplasty and turbinate reduction for. She also has a cyst in the right maxillary sinus and it's unclear if this is contributing to her issues but with her severe pressure and symptoms she really wants to proceed with taking care of that  as well as only the right side will be opened up from a sinus perspective. We discussed the risks, benefits, and options. All her questions were answered and consent was obtained.  Procedure: Patient was taken to the operating room placed in the supine position after general endotracheal tube anesthesia the patient was prepped and draped in the usual sterile manner. The fusion computer guidance system was positioned calibrated with excellent accuracy. The oxymetazoline pledgets were placed into the nose and then the septum inferior turbinates were injected with 1% lidocaine with 1 100,000 epinephrine. A left hemitransfixion incision was performed raised a mucoperichondrial and ostial flap. The cartilage was divided about 2 cm posterior to the caudal strut and this deviated portion of the cartilage was removed. The Jansen-Middleton forceps were then used to remove the deviated portion of the bone and then the inferior spur was removed with a Therapist, nutritionalreer elevator. This corrected the septal deflection. The right sinus was then opened using the microdebrider infusion guidance the uncinate process was removed up to the attachment of the middle turbinate the antrostomy is open widely. A curved suction was used to open the cyst  at the base of the sinus. The anterior ethmoid was opened also with the microdebrider and Fusion  guidance. There was some thickened material in the anterior ethmoid. There was no purulence. The turbinates were then infractured midline incision made with a 15 blade mucosal flap elevated superiorly and the inferior mucosa and bone were removed the turbinate scissors and the microdebrider. The edge was cauterized with suction cautery and the flap was laid down over the raw surface and outfracture bilateral turbinates. The nasopharynx suctioned out of all blood and debris. Oxymetazoline pledget was placed into the right sinus and into the nose bilaterally to be removed in the recovery room. The patient was awake and brought to recovery room in stable condition counts correct

## 2014-05-10 NOTE — H&P (Signed)
Katie Mcpherson is an 43 y.o. female.   Chief Complaint: chronic sinusitis HPI: Hx of sinusitis and nasal obstruction  Past Medical History  Diagnosis Date  . Chronic back pain     Past Surgical History  Procedure Laterality Date  . Spinal growth rods  1997    thoracic-lumbar  . Cesarean section  2005  . Cholecystectomy  2010  . Tonsillectomy    . Wisdom tooth extraction      History reviewed. No pertinent family history. Social History:  reports that she has never smoked. She does not have any smokeless tobacco history on file. She reports that she drinks alcohol. She reports that she does not use illicit drugs.  Allergies: No Known Allergies  Medications Prior to Admission  Medication Sig Dispense Refill  . HYDROcodone-acetaminophen (NORCO/VICODIN) 5-325 MG per tablet Take 1 tablet by mouth every 6 (six) hours as needed for moderate pain.        Results for orders placed during the hospital encounter of 05/10/14 (from the past 48 hour(s))  POCT HEMOGLOBIN-HEMACUE     Status: None   Collection Time    05/10/14  6:45 AM      Result Value Ref Range   Hemoglobin 14.5  12.0 - 15.0 g/dL   No results found.  Review of Systems  Constitutional: Negative.   HENT: Negative.   Eyes: Negative.   Respiratory: Negative.   Cardiovascular: Negative.   Skin: Negative.   Neurological: Negative.     Blood pressure 112/74, pulse 84, temperature 97.7 F (36.5 C), temperature source Oral, resp. rate 16, height 5\' 4"  (1.626 m), weight 63.674 kg (140 lb 6 oz), last menstrual period 05/02/2014, SpO2 99.00%. Physical Exam  Constitutional: She appears well-developed and well-nourished.  HENT:  Head: Normocephalic and atraumatic.  Mouth/Throat: Oropharynx is clear and moist.  Eyes: Pupils are equal, round, and reactive to light.  Neck: Normal range of motion. Neck supple.  Cardiovascular: Normal rate.   Respiratory: Effort normal.  GI: Soft.     Assessment/Plan Chtronic sinusitis-  discussed surgery and ready to proceed  Suzanna ObeyBYERS, Katie Mcpherson 05/10/2014, 7:38 AM

## 2014-05-13 ENCOUNTER — Encounter (HOSPITAL_BASED_OUTPATIENT_CLINIC_OR_DEPARTMENT_OTHER): Payer: Self-pay | Admitting: Otolaryngology

## 2014-08-19 ENCOUNTER — Other Ambulatory Visit: Payer: Self-pay | Admitting: Otolaryngology

## 2014-08-27 ENCOUNTER — Other Ambulatory Visit: Payer: Self-pay | Admitting: Otolaryngology

## 2014-08-27 DIAGNOSIS — J329 Chronic sinusitis, unspecified: Secondary | ICD-10-CM

## 2014-08-27 DIAGNOSIS — H6981 Other specified disorders of Eustachian tube, right ear: Secondary | ICD-10-CM

## 2014-09-09 ENCOUNTER — Ambulatory Visit
Admission: RE | Admit: 2014-09-09 | Discharge: 2014-09-09 | Disposition: A | Discharge status: Home / Self Care | Payer: 59 | Source: Ambulatory Visit | Attending: Otolaryngology | Admitting: Otolaryngology

## 2014-09-09 DIAGNOSIS — H6981 Other specified disorders of Eustachian tube, right ear: Secondary | ICD-10-CM

## 2014-09-09 DIAGNOSIS — J329 Chronic sinusitis, unspecified: Secondary | ICD-10-CM

## 2014-09-09 MED ORDER — GADOBENATE DIMEGLUMINE 529 MG/ML IV SOLN
13.0000 mL | Freq: Once | INTRAVENOUS | Status: AC | PRN
  Administered 2014-09-09: 13 mL via INTRAVENOUS

## 2014-11-20 ENCOUNTER — Other Ambulatory Visit: Payer: Self-pay

## 2014-11-20 DIAGNOSIS — Z1231 Encounter for screening mammogram for malignant neoplasm of breast: Secondary | ICD-10-CM

## 2014-12-06 ENCOUNTER — Ambulatory Visit
Admission: RE | Admit: 2014-12-06 | Discharge: 2014-12-06 | Disposition: A | Discharge status: Home / Self Care | Payer: 59 | Source: Ambulatory Visit

## 2014-12-06 DIAGNOSIS — Z1231 Encounter for screening mammogram for malignant neoplasm of breast: Secondary | ICD-10-CM

## 2014-12-09 ENCOUNTER — Other Ambulatory Visit: Payer: Self-pay | Admitting: Family Medicine

## 2014-12-10 LAB — CYTOLOGY - PAP

## 2015-01-10 ENCOUNTER — Other Ambulatory Visit: Payer: Self-pay | Admitting: Gastroenterology

## 2015-01-10 DIAGNOSIS — R7989 Other specified abnormal findings of blood chemistry: Secondary | ICD-10-CM

## 2015-01-10 DIAGNOSIS — R945 Abnormal results of liver function studies: Principal | ICD-10-CM

## 2015-01-24 ENCOUNTER — Other Ambulatory Visit: Payer: Self-pay | Admitting: Gastroenterology

## 2015-01-24 ENCOUNTER — Ambulatory Visit
Admission: RE | Admit: 2015-01-24 | Discharge: 2015-01-24 | Disposition: A | Discharge status: Home / Self Care | Payer: 59 | Source: Ambulatory Visit | Attending: Gastroenterology | Admitting: Gastroenterology

## 2015-01-24 DIAGNOSIS — R945 Abnormal results of liver function studies: Principal | ICD-10-CM

## 2015-01-24 DIAGNOSIS — R7989 Other specified abnormal findings of blood chemistry: Secondary | ICD-10-CM

## 2016-01-19 ENCOUNTER — Other Ambulatory Visit: Payer: Self-pay

## 2016-01-19 DIAGNOSIS — Z1231 Encounter for screening mammogram for malignant neoplasm of breast: Secondary | ICD-10-CM

## 2016-02-10 ENCOUNTER — Ambulatory Visit
Admission: RE | Admit: 2016-02-10 | Discharge: 2016-02-10 | Disposition: A | Discharge status: Home / Self Care | Payer: 59 | Source: Ambulatory Visit

## 2016-02-10 DIAGNOSIS — Z1231 Encounter for screening mammogram for malignant neoplasm of breast: Secondary | ICD-10-CM

## 2017-01-31 ENCOUNTER — Other Ambulatory Visit: Payer: Self-pay | Admitting: Family Medicine

## 2017-01-31 DIAGNOSIS — Z1231 Encounter for screening mammogram for malignant neoplasm of breast: Secondary | ICD-10-CM

## 2017-02-10 ENCOUNTER — Ambulatory Visit
Admission: RE | Admit: 2017-02-10 | Discharge: 2017-02-10 | Disposition: A | Discharge status: Home / Self Care | Payer: 59 | Source: Ambulatory Visit | Attending: Family Medicine | Admitting: Family Medicine

## 2017-02-10 DIAGNOSIS — Z1231 Encounter for screening mammogram for malignant neoplasm of breast: Secondary | ICD-10-CM

## 2017-12-22 ENCOUNTER — Other Ambulatory Visit: Payer: Self-pay | Admitting: Family Medicine

## 2017-12-22 ENCOUNTER — Other Ambulatory Visit (HOSPITAL_COMMUNITY)
Admission: RE | Admit: 2017-12-22 | Discharge: 2017-12-22 | Disposition: A | Discharge status: Home / Self Care | Payer: 59 | Source: Ambulatory Visit | Attending: Family Medicine | Admitting: Family Medicine

## 2017-12-22 DIAGNOSIS — Z124 Encounter for screening for malignant neoplasm of cervix: Secondary | ICD-10-CM | POA: Diagnosis not present

## 2017-12-24 LAB — CYTOLOGY - PAP
Diagnosis: UNDETERMINED — AB
HPV (WINDOPATH): NOT DETECTED

## 2018-02-13 ENCOUNTER — Other Ambulatory Visit: Payer: Self-pay | Admitting: Family Medicine

## 2018-02-13 DIAGNOSIS — Z1231 Encounter for screening mammogram for malignant neoplasm of breast: Secondary | ICD-10-CM

## 2018-02-23 ENCOUNTER — Other Ambulatory Visit: Payer: Self-pay | Admitting: Otolaryngology

## 2018-02-23 DIAGNOSIS — R519 Headache, unspecified: Secondary | ICD-10-CM

## 2018-02-23 DIAGNOSIS — R51 Headache: Principal | ICD-10-CM

## 2018-03-22 ENCOUNTER — Ambulatory Visit
Admission: RE | Admit: 2018-03-22 | Discharge: 2018-03-22 | Disposition: A | Discharge status: Home / Self Care | Payer: 59 | Source: Ambulatory Visit | Attending: Otolaryngology | Admitting: Otolaryngology

## 2018-03-22 DIAGNOSIS — R519 Headache, unspecified: Secondary | ICD-10-CM

## 2018-03-22 DIAGNOSIS — R51 Headache: Principal | ICD-10-CM

## 2018-03-22 MED ORDER — GADOBENATE DIMEGLUMINE 529 MG/ML IV SOLN
13.0000 mL | Freq: Once | INTRAVENOUS | Status: AC | PRN
  Administered 2018-03-22: 13 mL via INTRAVENOUS

## 2018-03-27 ENCOUNTER — Ambulatory Visit: Payer: 59

## 2019-12-26 DIAGNOSIS — F4322 Adjustment disorder with anxiety: Secondary | ICD-10-CM | POA: Diagnosis not present

## 2020-01-06 DIAGNOSIS — Z20828 Contact with and (suspected) exposure to other viral communicable diseases: Secondary | ICD-10-CM | POA: Diagnosis not present

## 2020-02-04 DIAGNOSIS — F4322 Adjustment disorder with anxiety: Secondary | ICD-10-CM | POA: Diagnosis not present

## 2020-03-24 DIAGNOSIS — F4322 Adjustment disorder with anxiety: Secondary | ICD-10-CM | POA: Diagnosis not present

## 2020-05-26 DIAGNOSIS — F4322 Adjustment disorder with anxiety: Secondary | ICD-10-CM | POA: Diagnosis not present

## 2020-09-15 DIAGNOSIS — L02213 Cutaneous abscess of chest wall: Secondary | ICD-10-CM | POA: Diagnosis not present

## 2020-12-29 ENCOUNTER — Other Ambulatory Visit (HOSPITAL_COMMUNITY)
Admission: RE | Admit: 2020-12-29 | Discharge: 2020-12-29 | Disposition: A | Discharge status: Home / Self Care | Payer: BC Managed Care – PPO | Source: Ambulatory Visit | Attending: Family Medicine | Admitting: Family Medicine

## 2020-12-29 ENCOUNTER — Other Ambulatory Visit: Payer: Self-pay | Admitting: Family Medicine

## 2020-12-29 DIAGNOSIS — Z124 Encounter for screening for malignant neoplasm of cervix: Secondary | ICD-10-CM | POA: Diagnosis not present

## 2020-12-29 DIAGNOSIS — E7800 Pure hypercholesterolemia, unspecified: Secondary | ICD-10-CM | POA: Diagnosis not present

## 2020-12-29 DIAGNOSIS — Z0000 Encounter for general adult medical examination without abnormal findings: Secondary | ICD-10-CM | POA: Diagnosis not present

## 2020-12-29 DIAGNOSIS — E559 Vitamin D deficiency, unspecified: Secondary | ICD-10-CM | POA: Diagnosis not present

## 2020-12-29 DIAGNOSIS — Z1231 Encounter for screening mammogram for malignant neoplasm of breast: Secondary | ICD-10-CM

## 2020-12-31 LAB — CYTOLOGY - PAP
Comment: NEGATIVE
Diagnosis: UNDETERMINED — AB
High risk HPV: NEGATIVE

## 2021-02-24 ENCOUNTER — Other Ambulatory Visit: Payer: Self-pay

## 2021-02-24 ENCOUNTER — Ambulatory Visit
Admission: RE | Admit: 2021-02-24 | Discharge: 2021-02-24 | Disposition: A | Discharge status: Home / Self Care | Payer: BC Managed Care – PPO | Source: Ambulatory Visit | Attending: Family Medicine | Admitting: Family Medicine

## 2021-02-24 DIAGNOSIS — Z1231 Encounter for screening mammogram for malignant neoplasm of breast: Secondary | ICD-10-CM

## 2022-01-05 DIAGNOSIS — E7800 Pure hypercholesterolemia, unspecified: Secondary | ICD-10-CM | POA: Diagnosis not present

## 2022-01-05 DIAGNOSIS — Z0000 Encounter for general adult medical examination without abnormal findings: Secondary | ICD-10-CM | POA: Diagnosis not present

## 2022-01-05 DIAGNOSIS — Z23 Encounter for immunization: Secondary | ICD-10-CM | POA: Diagnosis not present

## 2022-02-18 DIAGNOSIS — K648 Other hemorrhoids: Secondary | ICD-10-CM | POA: Diagnosis not present

## 2022-02-18 DIAGNOSIS — Z1211 Encounter for screening for malignant neoplasm of colon: Secondary | ICD-10-CM | POA: Diagnosis not present

## 2022-03-28 IMAGING — MG MM DIGITAL SCREENING BILAT W/ TOMO AND CAD
8 series · 9 of 24 positions shown · non-contrast
Comparison: Previous exam(s).

CLINICAL DATA: Screening.

EXAM:
DIGITAL SCREENING BILATERAL MAMMOGRAM WITH TOMOSYNTHESIS AND CAD
TECHNIQUE: Bilateral screening digital craniocaudal and mediolateral oblique
mammograms were obtained. Bilateral screening digital breast
tomosynthesis was performed. The images were evaluated with
computer-aided detection.

[L CC synth-2D]
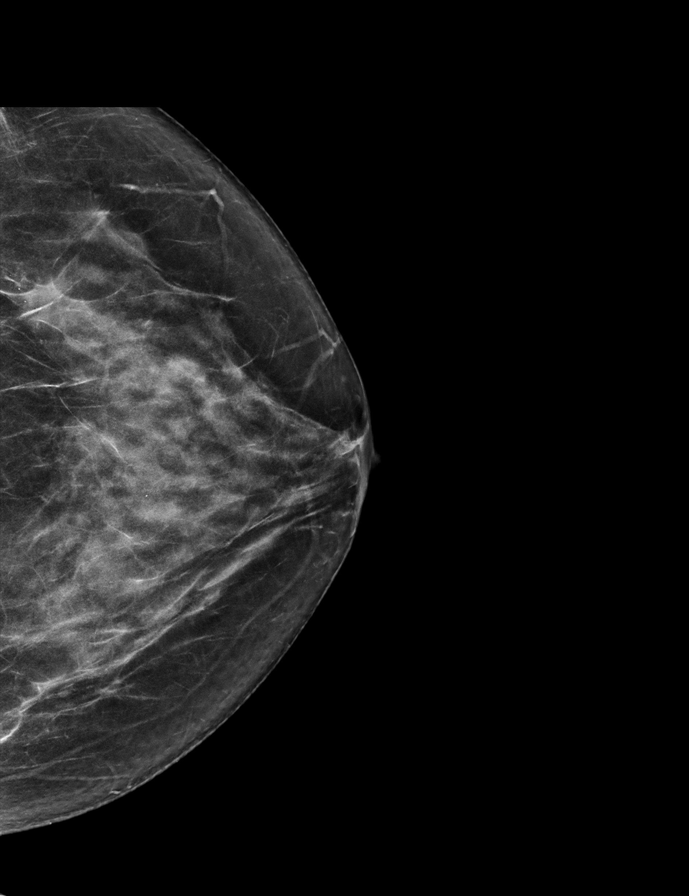

[R CC synth-2D]
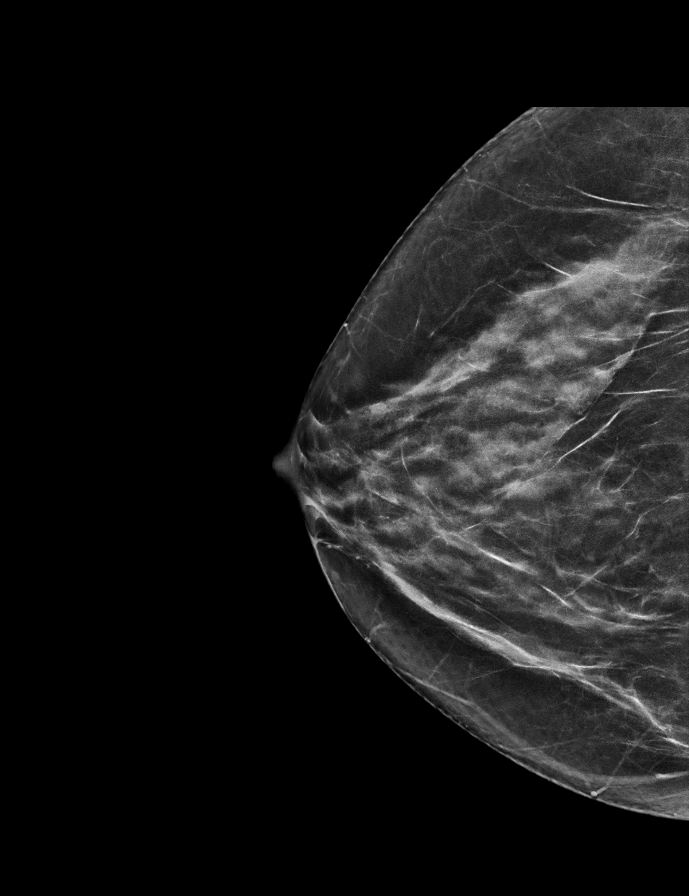

[L MLO synth-2D]
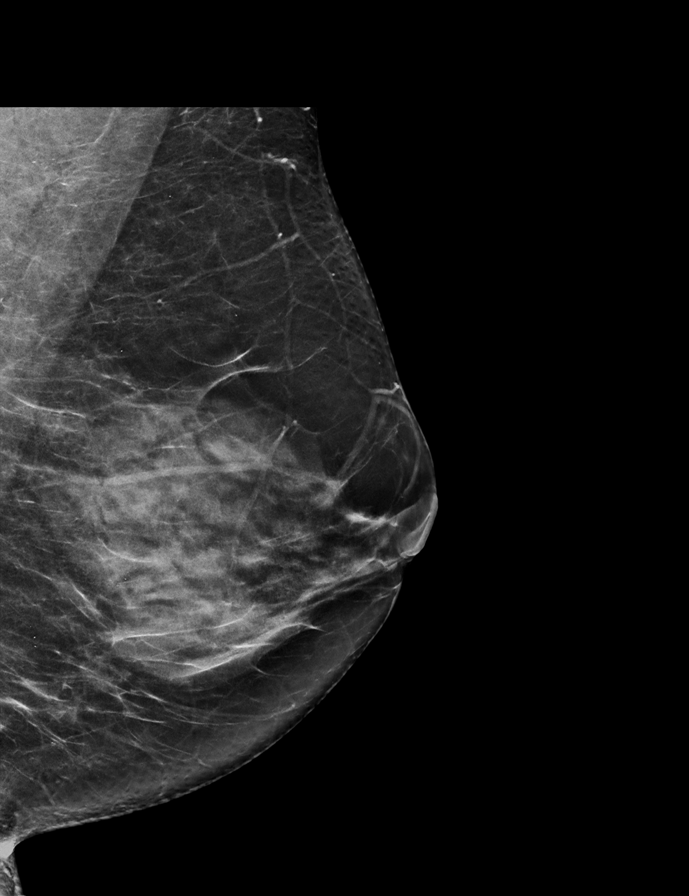

[R MLO synth-2D]
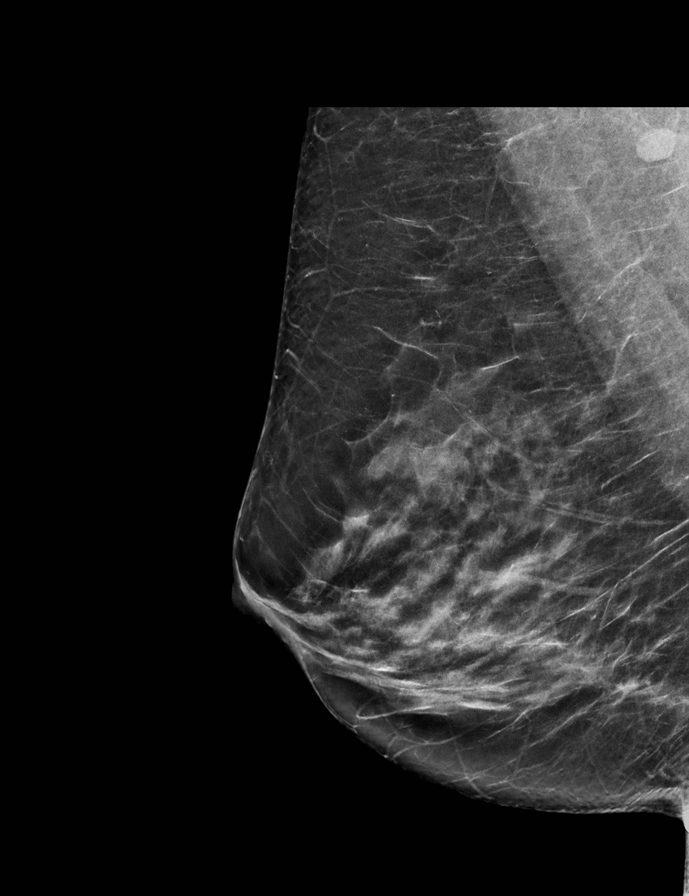

[R MLO tomo · 2 of 66 frames shown]
[frame 22/66]
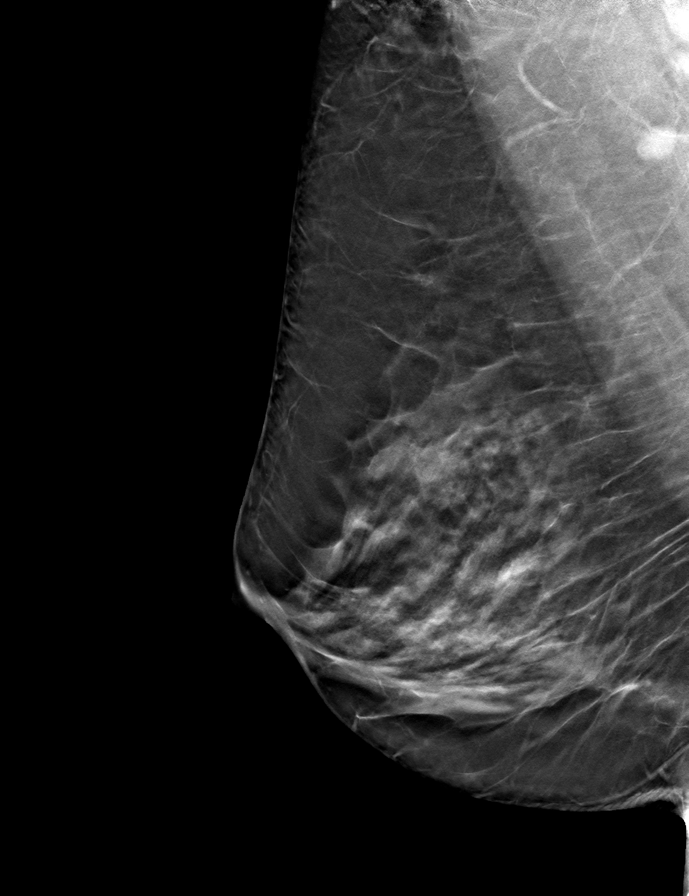
[frame 33/66]
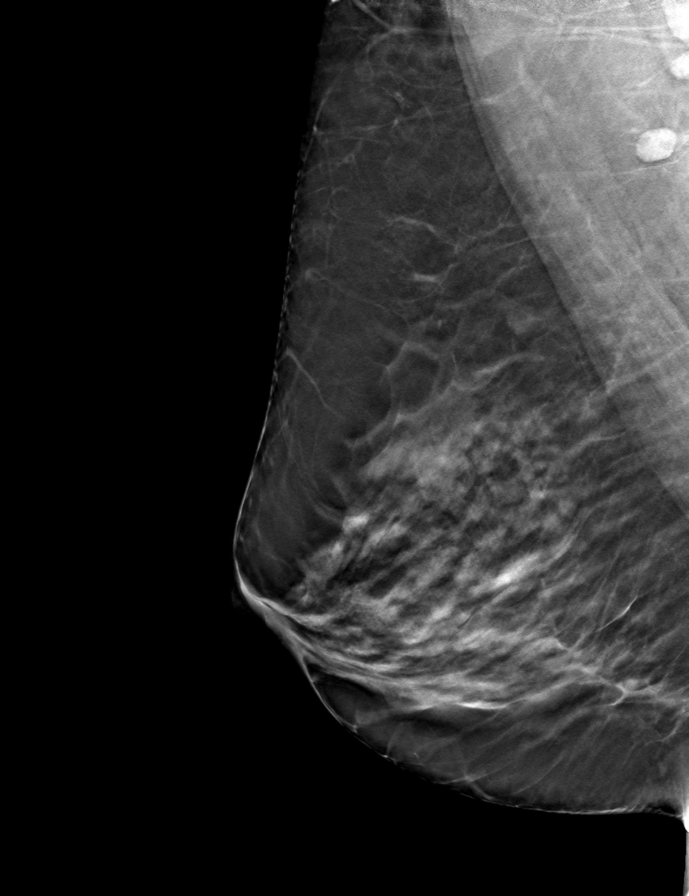

[L CC tomo · tomo slice 32/63.0]
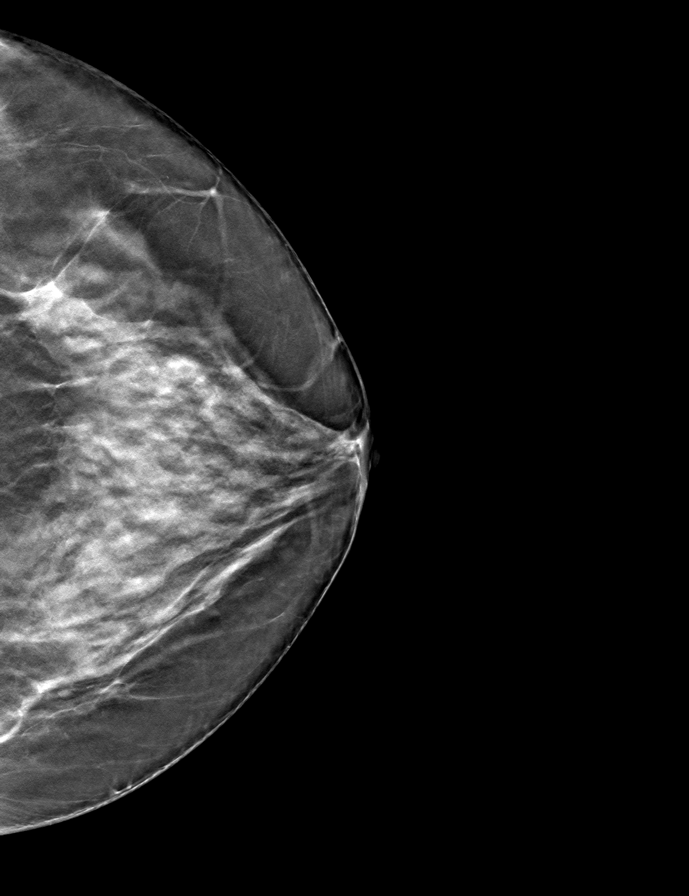

[L MLO tomo · tomo slice 35/69.0]
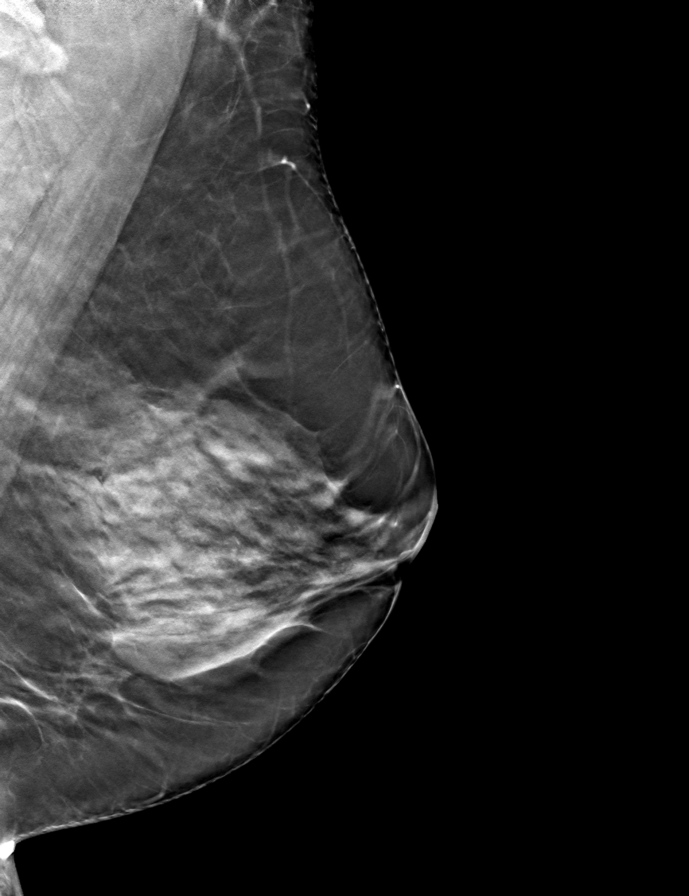

[R CC tomo · tomo slice 31/62.0]
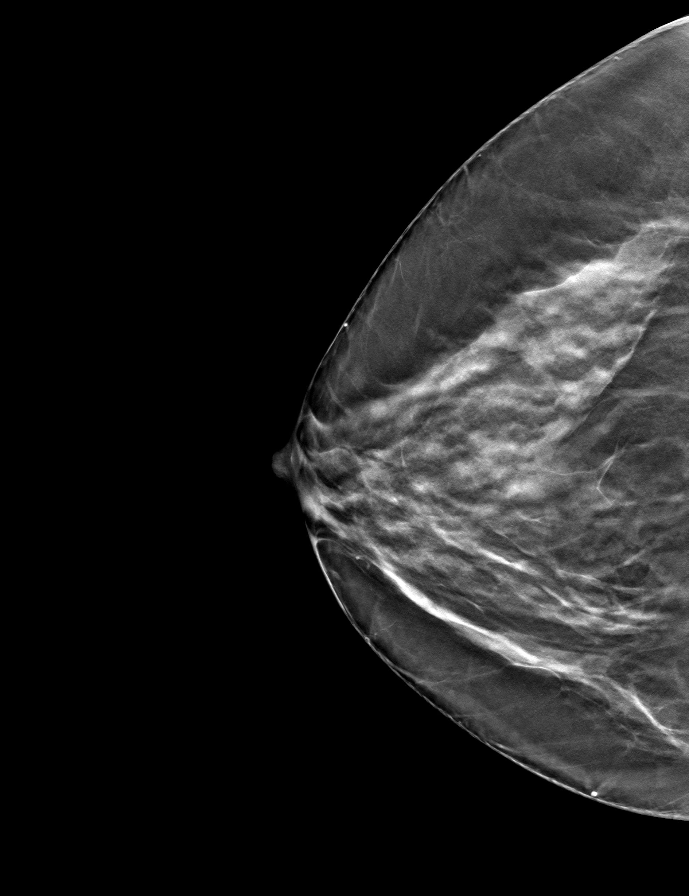

[9 of 24 positions shown; findings below may reference images not displayed]

ACR Breast Density Category c: The breast tissue is heterogeneously
dense, which may obscure small masses.
FINDINGS: There are no findings suspicious for malignancy. The images were
evaluated with computer-aided detection.
IMPRESSION: No mammographic evidence of malignancy. A result letter of this
screening mammogram will be mailed directly to the patient.

RECOMMENDATION:
Screening mammogram in one year. (Code:T4-5-GWO)

BI-RADS CATEGORY  1: Negative.

## 2022-07-27 ENCOUNTER — Other Ambulatory Visit: Payer: Self-pay | Admitting: Family Medicine

## 2022-07-27 DIAGNOSIS — Z1231 Encounter for screening mammogram for malignant neoplasm of breast: Secondary | ICD-10-CM

## 2022-09-17 ENCOUNTER — Ambulatory Visit
Admission: RE | Admit: 2022-09-17 | Discharge: 2022-09-17 | Disposition: A | Discharge status: Home / Self Care | Payer: BC Managed Care – PPO | Source: Ambulatory Visit | Attending: Family Medicine | Admitting: Family Medicine

## 2022-09-17 DIAGNOSIS — Z1231 Encounter for screening mammogram for malignant neoplasm of breast: Secondary | ICD-10-CM

## 2022-09-22 ENCOUNTER — Other Ambulatory Visit: Payer: Self-pay | Admitting: Family Medicine

## 2022-09-22 DIAGNOSIS — R928 Other abnormal and inconclusive findings on diagnostic imaging of breast: Secondary | ICD-10-CM

## 2022-10-12 ENCOUNTER — Other Ambulatory Visit: Payer: Self-pay | Admitting: Family Medicine

## 2022-10-12 ENCOUNTER — Ambulatory Visit
Admission: RE | Admit: 2022-10-12 | Discharge: 2022-10-12 | Disposition: A | Discharge status: Home / Self Care | Payer: BC Managed Care – PPO | Source: Ambulatory Visit | Attending: Family Medicine | Admitting: Family Medicine

## 2022-10-12 DIAGNOSIS — N6311 Unspecified lump in the right breast, upper outer quadrant: Secondary | ICD-10-CM | POA: Diagnosis not present

## 2022-10-12 DIAGNOSIS — R928 Other abnormal and inconclusive findings on diagnostic imaging of breast: Secondary | ICD-10-CM

## 2022-10-12 DIAGNOSIS — R922 Inconclusive mammogram: Secondary | ICD-10-CM | POA: Diagnosis not present

## 2022-10-12 DIAGNOSIS — N6310 Unspecified lump in the right breast, unspecified quadrant: Secondary | ICD-10-CM

## 2022-10-12 DIAGNOSIS — N6312 Unspecified lump in the right breast, upper inner quadrant: Secondary | ICD-10-CM | POA: Diagnosis not present

## 2023-01-20 DIAGNOSIS — M25511 Pain in right shoulder: Secondary | ICD-10-CM | POA: Diagnosis not present

## 2023-01-20 DIAGNOSIS — E7800 Pure hypercholesterolemia, unspecified: Secondary | ICD-10-CM | POA: Diagnosis not present

## 2023-01-20 DIAGNOSIS — Z0000 Encounter for general adult medical examination without abnormal findings: Secondary | ICD-10-CM | POA: Diagnosis not present

## 2023-02-02 DIAGNOSIS — M25511 Pain in right shoulder: Secondary | ICD-10-CM | POA: Diagnosis not present

## 2023-05-11 ENCOUNTER — Other Ambulatory Visit: Payer: Self-pay | Admitting: Family Medicine

## 2023-05-11 ENCOUNTER — Ambulatory Visit
Admission: RE | Admit: 2023-05-11 | Discharge: 2023-05-11 | Disposition: A | Discharge status: Home / Self Care | Payer: PRIVATE HEALTH INSURANCE | Source: Ambulatory Visit | Attending: Family Medicine | Admitting: Family Medicine

## 2023-05-11 DIAGNOSIS — R928 Other abnormal and inconclusive findings on diagnostic imaging of breast: Secondary | ICD-10-CM

## 2023-05-11 DIAGNOSIS — N6310 Unspecified lump in the right breast, unspecified quadrant: Secondary | ICD-10-CM

## 2023-10-31 ENCOUNTER — Ambulatory Visit
Admission: RE | Admit: 2023-10-31 | Discharge: 2023-10-31 | Disposition: A | Discharge status: Home / Self Care | Payer: PRIVATE HEALTH INSURANCE | Source: Ambulatory Visit | Attending: Family Medicine | Admitting: Family Medicine

## 2023-10-31 DIAGNOSIS — N6310 Unspecified lump in the right breast, unspecified quadrant: Secondary | ICD-10-CM
